# Patient Record
Sex: Female | Born: 2000 | Race: Black or African American | Hispanic: No | Marital: Single | Smoking: Never smoker
Health system: Southern US, Community
[De-identification: ages and names within clinical notes are randomized; demographics above are authoritative.]

## PROBLEM LIST (undated history)

## (undated) DIAGNOSIS — L309 Dermatitis, unspecified: Secondary | ICD-10-CM

## (undated) DIAGNOSIS — J45909 Unspecified asthma, uncomplicated: Secondary | ICD-10-CM

## (undated) DIAGNOSIS — K219 Gastro-esophageal reflux disease without esophagitis: Secondary | ICD-10-CM

## (undated) HISTORY — PX: OTHER SURGICAL HISTORY: SHX169

---
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
## 2022-01-31 ENCOUNTER — Ambulatory Visit
Admission: EM | Admit: 2022-01-31 | Discharge: 2022-01-31 | Disposition: A | Discharge status: Home / Self Care | Payer: 59 | Attending: Internal Medicine | Admitting: Internal Medicine

## 2022-01-31 ENCOUNTER — Encounter: Payer: Self-pay | Admitting: Emergency Medicine

## 2022-01-31 ENCOUNTER — Inpatient Hospital Stay
Admission: RE | Admit: 2022-01-31 | Discharge: 2022-01-31 | Disposition: A | Discharge status: Home / Self Care | Payer: Medicaid Other | Source: Ambulatory Visit

## 2022-01-31 DIAGNOSIS — Z113 Encounter for screening for infections with a predominantly sexual mode of transmission: Secondary | ICD-10-CM | POA: Insufficient documentation

## 2022-01-31 DIAGNOSIS — N898 Other specified noninflammatory disorders of vagina: Secondary | ICD-10-CM | POA: Insufficient documentation

## 2022-01-31 MED ORDER — FLUCONAZOLE 150 MG PO TABS: 150.0000 mg | ORAL_TABLET | ORAL | 0 refills | Status: DC

## 2022-01-31 NOTE — ED Triage Notes (Signed)
Patient c/o vaginal discharge, milky white, itchy for several days.  No concern for STI.  Patient denies any OTC meds.

## 2022-01-31 NOTE — Discharge Instructions (Addendum)
It appears that you may have a yeast infection of the vaginal area.  This is being treated with Diflucan pills.  Your vaginal swab is pending.  We will call if anything is abnormal and treat as appropriate.  Please refrain from sexual activity until test results and treatment are complete.

## 2022-01-31 NOTE — ED Provider Notes (Signed)
[facility]    CSN: [id number] Arrival date & time: 01/31/22  1431      History   Chief Complaint Chief Complaint  Patient presents with   Vaginal Itching    Entered by patient    HPI Susan Rich is a 21 y.o. female.   Patient presents with milky white vaginal discharge and vaginal itching that has been present for a few days.  Patient denies any associated dysuria, urinary frequency, abdominal pain, fever, back pain, abnormal vaginal bleeding, hematuria.  Last menstrual cycle was 01/16/2022.  Patient denies any known exposure to STD and reports that she has not had unprotected intercourse for approximately 1 month.   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      Home Medications    Prior to Admission medications   Medication Sig Start Date End Date Taking? Authorizing Provider  Ascorbic Acid (VITAMIN C PO) Take 1 tablet by mouth daily.    Yes [provider]  cetirizine (ZYRTEC) 10 MG tablet Take 10 mg by mouth daily.   Yes [provider]  cholecalciferol (VITAMIN D3) 25 MCG (1000 UT) tablet Take 1,000 Units by mouth daily.   Yes [provider]  fluconazole (DIFLUCAN) 150 MG tablet Take 1 tablet (150 mg total) by mouth every 3 (three) days. Take first pill today.  If symptoms are still present, take second pill 3 days from first pill. 01/31/22  Yes Aamari Strawderman, Hildred Alamin E, FNP  COVID-19 Ad26 vaccine, JANSSEN/J&J, (JANSSEN COVID-19 VACCINE) 0.5 ML injection Inject into the muscle. 11/16/21   Carlyle Basques, MD  ibuprofen (ADVIL) 200 MG tablet Take 600 mg by mouth every 6 (six) hours as needed for moderate pain.    [provider]  metroNIDAZOLE (FLAGYL) 500 MG tablet Take 1 tablet (500 mg total) by mouth 2 (two) times daily. 03/01/21   Lamptey, Myrene Galas, MD    Family History History reviewed. No pertinent family history.  Social History Social History   Tobacco Use   Smoking status: Never   Smokeless tobacco: Never  Vaping Use   Vaping Use: Never used  Substance Use Topics   Alcohol use: Never   Drug use: Never     Allergies   Peanut-containing drug products   Review of Systems Review of Systems Per HPI  Physical Exam Triage Vital Signs ED Triage Vitals  Enc Vitals Group     BP 01/31/22 1443 115/68     Pulse Rate 01/31/22 1443 89     Resp 01/31/22 1443 18     Temp 01/31/22 1443 98.2 F (36.8 C)     Temp Source 01/31/22 1443 Oral     SpO2 01/31/22 1443 99 %     Weight 01/31/22 1445 170 lb (77.1 kg)     Height 01/31/22 1445 '5\' 9"'$  (1.753 m)     Head Circumference --      Peak Flow --      Pain Score 01/31/22  1445 0     Pain Loc --      Pain Edu? --      Excl. in [place]? --    No data found.  Updated Vital Signs BP 115/68 (BP Location: Right Arm)   Pulse 89   Temp 98.2 F (36.8 C) (Oral)   Resp 18   Ht '5\' 9"'$  (1.753 m)   Wt 170 lb (77.1 kg)   LMP 01/16/2022   SpO2 99%   BMI 25.10 kg/m   Visual Acuity Right Eye Distance:   Left Eye Distance:   Bilateral Distance:    Right Eye Near:   Left Eye Near:    Bilateral Near:     Physical Exam Constitutional:      General: She is not in acute distress.    Appearance: Normal appearance. She is not toxic-appearing or diaphoretic.  HENT:     Head: Normocephalic and atraumatic.  Eyes:     Extraocular Movements: Extraocular movements intact.     Conjunctiva/sclera: Conjunctivae normal.  Pulmonary:     Effort: Pulmonary effort is normal.  Genitourinary:    Comments: Deferred with shared  decision making.  Self swab performed. Neurological:     General: No focal deficit present.     Mental Status: She is alert and oriented to person, place, and time. Mental status is at baseline.  Psychiatric:        Mood and Affect: Mood normal.        Behavior: Behavior normal.        Thought Content: Thought content normal.        Judgment: Judgment normal.     UC Treatments / Results  Labs (all labs ordered are listed, but only abnormal results are displayed) Labs Reviewed  CERVICOVAGINAL ANCILLARY ONLY    EKG   Radiology No results found.  Procedures Procedures (including critical care time)  Medications Ordered in UC Medications - No data to display  Initial Impression / Assessment and Plan / UC Course  I have reviewed the triage vital signs and the nursing notes.  Pertinent labs & imaging results that were available during my care of the patient were reviewed by me and considered in my medical decision making (see chart for details).     Suspicious for candidiasis of vaginal area.  Will treat with Diflucan.  Cervicovaginal swab pending.  Patient wanted STD testing as well so this is included in cervicovaginal swab.  Patient advised to refrain from sexual activity until test results and treatment are complete.  Patient verbalized understanding and was agreeable with plan. Final Clinical Impressions(s) / UC Diagnoses   Final diagnoses:  Vaginal discharge  Screening examination for venereal disease     Discharge Instructions      It appears that you may have a yeast infection of the vaginal area.  This is being treated with Diflucan pills.  Your vaginal swab is pending.  We will call if anything is abnormal and treat as appropriate.  Please refrain from sexual activity until test results and treatment are complete.    ED Prescriptions     Medication Sig Dispense Auth. Provider   fluconazole (DIFLUCAN) 150 MG tablet Take 1 tablet (150 mg total) by mouth  every 3 (three) days. Take first pill today.  If symptoms are still present, take second pill 3 days from first pill. 2 tablet [place], Michele Rockers, [place]      PDMP not reviewed this encounter.   Teodora Medici, [place]  01/31/22 1458  

## 2022-02-01 ENCOUNTER — Telehealth (HOSPITAL_COMMUNITY): Payer: Self-pay | Admitting: Emergency Medicine

## 2022-02-01 LAB — CERVICOVAGINAL ANCILLARY ONLY
Bacterial Vaginitis (gardnerella): POSITIVE — AB
Candida Glabrata: NEGATIVE
Candida Vaginitis: POSITIVE — AB
Chlamydia: NEGATIVE
Comment: NEGATIVE
Comment: NEGATIVE
Comment: NEGATIVE
Comment: NEGATIVE
Comment: NEGATIVE
Comment: NORMAL
Neisseria Gonorrhea: NEGATIVE
Trichomonas: NEGATIVE

## 2022-02-01 MED ORDER — METRONIDAZOLE 500 MG PO TABS: 500.0000 mg | ORAL_TABLET | Freq: Two times a day (BID) | ORAL | 0 refills | Status: DC

## 2022-03-12 IMAGING — DX DG CHEST 1V PORT
1 series · 1 of 1 positions shown · non-contrast
Comparison: Two-view chest x-ray 10/04/2018

CLINICAL DATA: Mild left-sided chest pain.

EXAM:
PORTABLE CHEST 1 VIEW

[chest ap]
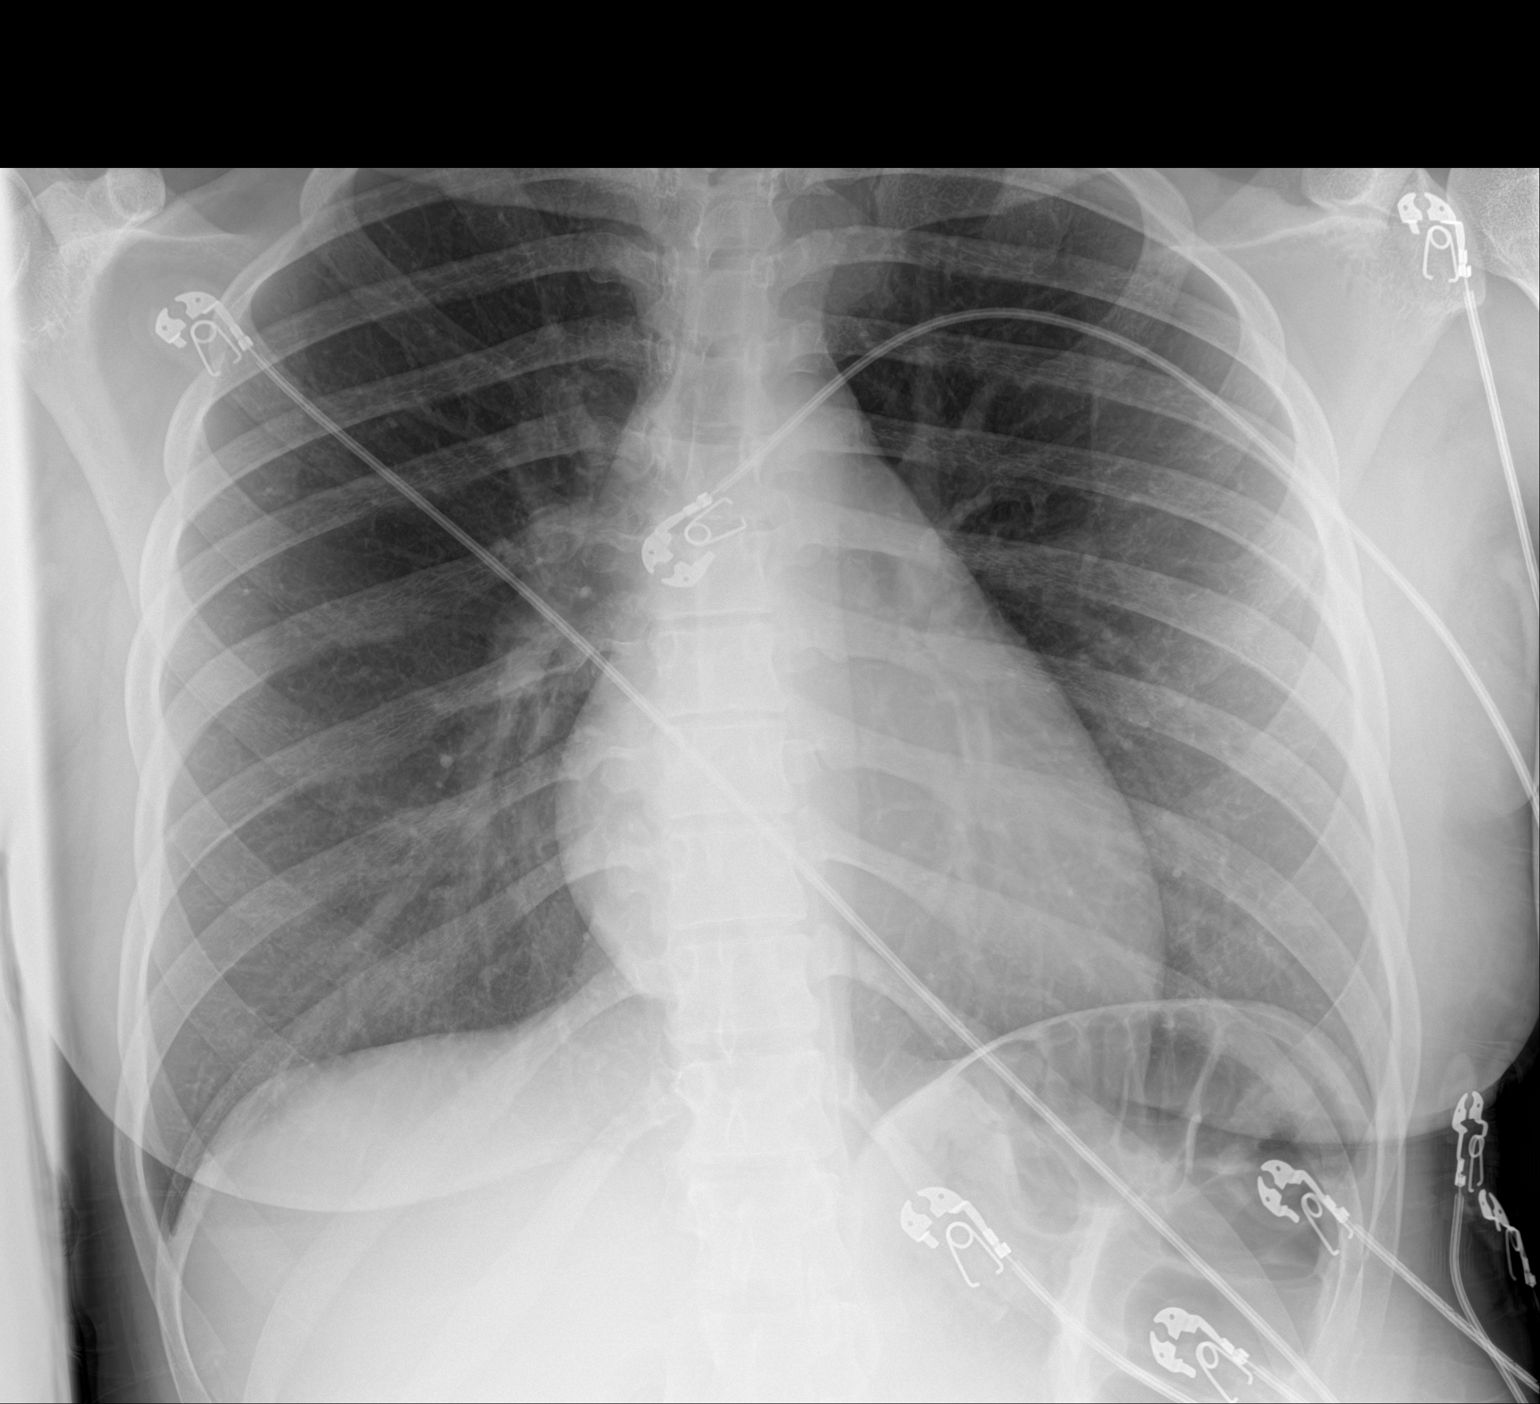

[1 of 1 positions shown; findings below may reference images not displayed]

FINDINGS: The heart size and mediastinal contours are within normal limits.
Both lungs are clear. The visualized skeletal structures are
unremarkable.
IMPRESSION: Negative one-view chest x-ray

## 2023-07-12 ENCOUNTER — Other Ambulatory Visit (HOSPITAL_BASED_OUTPATIENT_CLINIC_OR_DEPARTMENT_OTHER): Payer: Self-pay

## 2024-05-01 ENCOUNTER — Ambulatory Visit: Admission: EM | Admit: 2024-05-01 | Discharge: 2024-05-01 | Disposition: A | Discharge status: Home / Self Care

## 2024-05-01 DIAGNOSIS — J029 Acute pharyngitis, unspecified: Secondary | ICD-10-CM | POA: Diagnosis not present

## 2024-05-01 DIAGNOSIS — J039 Acute tonsillitis, unspecified: Secondary | ICD-10-CM

## 2024-05-01 LAB — POCT RAPID STREP A (OFFICE): Rapid Strep A Screen: NEGATIVE

## 2024-05-01 MED ORDER — AMOXICILLIN-POT CLAVULANATE 875-125 MG PO TABS: 1.0000 | ORAL_TABLET | Freq: Two times a day (BID) | ORAL | 0 refills | Status: AC

## 2024-05-01 MED ORDER — PREDNISONE 20 MG PO TABS
ORAL_TABLET | ORAL | 0 refills | Status: DC
Start: 2024-05-01 — End: 2024-05-02

## 2024-05-01 NOTE — Discharge Instructions (Addendum)
 Advised patient to take medications as directed with food to completion.  Advised patient to take prednisone  with first dose of Augmentin  for the next 5 of 7 days.  Advised if symptoms worsen and or unresolved please follow-up with your PCP or here for further evaluation.  Encouraged to increase daily water intake to 64 ounces per day while taking these medications.  Advised if symptoms worsen and/or unresolved please follow with your PCP or here for further evaluation.

## 2024-05-01 NOTE — ED Provider Notes (Signed)
 Susan Rich CARE    CSN: [id number] Arrival date & time: 05/01/24  1319      History   Chief Complaint Chief Complaint  Patient presents with   Sore Throat    HPI Susan Rich is a 23 y.o. female.   HPI 23 year old female presents with sore throat for 2 days.  Slight dry cough.  Reports taking allergy medicine.  PMH significant for asthma and eczema.  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      Home Medications    Prior to Admission medications   Medication Sig Start Date End Date Taking? Authorizing Provider  amoxicillin -clavulanate (AUGMENTIN ) 875-125 MG tablet Take 1 tablet by mouth every 12 (twelve) hours. 05/01/24  Yes Teddy Sharper, FNP  omeprazole  (PRILOSEC) 40 MG capsule Take 40 mg by mouth daily.   Yes [provider]  predniSONE  (DELTASONE ) 20 MG tablet Take 3 tabs PO daily x 5 days. 05/01/24  Yes Teddy Sharper, FNP  Ascorbic Acid (VITAMIN C PO) Take 1 tablet by mouth daily.     [provider]  cetirizine (ZYRTEC) 10 MG tablet Take 10 mg by mouth daily.    [provider]  cholecalciferol (VITAMIN D3) 25 MCG (1000 UT) tablet Take 1,000 Units by mouth daily.    [provider]  COVID-19 Ad26 vaccine, JANSSEN/J&J, (JANSSEN COVID-19 VACCINE) 0.5 ML injection Inject into the muscle. 11/16/21   Luiz Channel, MD  ibuprofen  (ADVIL ) 200 MG tablet Take 600 mg by mouth every 6 (six) hours as needed for moderate pain.    [provider]    Family History History reviewed. No pertinent family  history.  Social History Social History   Tobacco Use   Smoking status: Never   Smokeless tobacco: Never  Vaping Use   Vaping status: Never Used  Substance Use Topics   Alcohol use: Never   Drug use: Never     Allergies   Peanut-containing drug products   Review of Systems Review of Systems   Physical Exam Triage Vital Signs ED Triage Vitals  Encounter Vitals Group     BP 05/01/24 1331 114/76     Girls Systolic BP Percentile --      Girls Diastolic BP Percentile --      Boys Systolic BP Percentile --      Boys Diastolic BP Percentile --      Pulse Rate 05/01/24 1331 80     Resp 05/01/24 1331 17     Temp 05/01/24 1331 98.3 F (36.8 C)     Temp Source 05/01/24 1331 Oral     SpO2 05/01/24 1331 100 %     Weight --      Height --      Head Circumference --      Peak Flow --      Pain Score 05/01/24 1332 6     Pain Loc --      Pain Education --      Exclude from [employer]  Chart --    No data found.  Updated Vital Signs BP 114/76 (BP Location: Right Arm)   Pulse 80   Temp 98.3 F (36.8 C) (Oral)   Resp 17   LMP 04/27/2024 (Exact Date)   SpO2 100%    Physical Exam Vitals and nursing note reviewed.  Constitutional:      Appearance: She is well-developed. She is obese.  HENT:     Head: Normocephalic and atraumatic.     Right Ear: Tympanic membrane, ear canal and external ear normal.     Left Ear: Tympanic membrane, ear canal and external ear normal.     Mouth/Throat:     Mouth: Mucous membranes are moist.     Pharynx: Oropharynx is clear. Uvula midline. Posterior oropharyngeal erythema present.     Tonsils: Tonsillar exudate present. 4+ on the right. 4+ on the left.  Eyes:     Extraocular Movements: Extraocular movements intact.     Conjunctiva/sclera: Conjunctivae normal.     Pupils: Pupils are equal, round, and reactive to light.  Cardiovascular:     Rate and Rhythm: Normal rate and regular rhythm.     Heart sounds: Normal heart sounds. No murmur  heard. Pulmonary:     Effort: Pulmonary effort is normal.     Breath sounds: Normal breath sounds. No wheezing, rhonchi or rales.  Musculoskeletal:        General: Normal range of motion.     Cervical back: Normal range of motion and neck supple.  Skin:    General: Skin is warm and dry.  Neurological:     General: No focal deficit present.     Mental Status: She is alert and oriented to person, place, and time.  Psychiatric:        Mood and Affect: Mood normal.        Behavior: Behavior normal.      UC Treatments / Results  Labs (all labs ordered are listed, but only abnormal results are displayed) Labs Reviewed  POCT RAPID STREP A (OFFICE)    EKG   Radiology No results found.  Procedures Procedures (including critical care time)  Medications Ordered in UC Medications - No data to display  Initial Impression / Assessment and Plan / UC Course  I have reviewed the triage vital signs and the nursing notes.  Pertinent labs & imaging results that were available during my care of the patient were reviewed by me and considered in my medical decision making (see chart for details).     MDM: 1.  Acute tonsillitis, unspecified etiology-Rx'd Augmentin  875/125 mg tablet: Take 1 tablet twice daily x 7 days; 2.  Sore throat-rapid strep negative, Rx'd prednisone  20 mg tablet: Take 3 tablets p.o. daily x 5 days Final Clinical Impressions(s) / UC Diagnoses   Final diagnoses:  Sore throat  Acute tonsillitis, unspecified etiology     Discharge Instructions      Advised patient to take medications as directed with food to completion.  Advised patient to take prednisone  with first dose of Augmentin  for the next 5 of 7 days.  Advised if symptoms worsen and or unresolved please follow-up with your PCP or here for further evaluation.  Encouraged to increase daily water intake to 64 ounces per day while taking these medications.  Advised if symptoms worsen and/or unresolved please  follow with your PCP or here for further evaluation.     ED Prescriptions     Medication Sig Dispense Auth. Provider  amoxicillin -clavulanate (AUGMENTIN ) 875-125 MG tablet Take 1 tablet by mouth every 12 (twelve) hours. 14 tablet Joell Buerger, FNP   predniSONE  (DELTASONE ) 20 MG tablet Take 3 tabs PO daily x 5 days. 15 tablet Axcel Horsch, FNP      PDMP not reviewed this encounter.   Teddy Sharper, FNP 05/01/24 1425

## 2024-05-01 NOTE — ED Triage Notes (Signed)
 Pt c/o sore throat x 2 days. Slight dry cough. No known fever. Taking nyquil and dayquil prn. Also takes OTC allergy med daily.

## 2024-05-02 ENCOUNTER — Other Ambulatory Visit (HOSPITAL_COMMUNITY)
Admission: RE | Admit: 2024-05-02 | Discharge: 2024-05-02 | Disposition: A | Discharge status: Home / Self Care | Source: Ambulatory Visit

## 2024-05-02 ENCOUNTER — Ambulatory Visit

## 2024-05-02 ENCOUNTER — Telehealth: Payer: Self-pay

## 2024-05-02 VITALS — BP 116/83 | HR 96 | Temp 97.8°F | Ht 69.0 in | Wt 213.0 lb

## 2024-05-02 DIAGNOSIS — Z13228 Encounter for screening for other metabolic disorders: Secondary | ICD-10-CM

## 2024-05-02 DIAGNOSIS — Z0000 Encounter for general adult medical examination without abnormal findings: Secondary | ICD-10-CM | POA: Insufficient documentation

## 2024-05-02 DIAGNOSIS — Z91010 Allergy to peanuts: Secondary | ICD-10-CM | POA: Insufficient documentation

## 2024-05-02 DIAGNOSIS — J4520 Mild intermittent asthma, uncomplicated: Secondary | ICD-10-CM

## 2024-05-02 DIAGNOSIS — R490 Dysphonia: Secondary | ICD-10-CM

## 2024-05-02 DIAGNOSIS — G43909 Migraine, unspecified, not intractable, without status migrainosus: Secondary | ICD-10-CM

## 2024-05-02 DIAGNOSIS — R41840 Attention and concentration deficit: Secondary | ICD-10-CM

## 2024-05-02 DIAGNOSIS — Z113 Encounter for screening for infections with a predominantly sexual mode of transmission: Secondary | ICD-10-CM

## 2024-05-02 DIAGNOSIS — J45909 Unspecified asthma, uncomplicated: Secondary | ICD-10-CM | POA: Insufficient documentation

## 2024-05-02 DIAGNOSIS — Z1159 Encounter for screening for other viral diseases: Secondary | ICD-10-CM

## 2024-05-02 DIAGNOSIS — G43109 Migraine with aura, not intractable, without status migrainosus: Secondary | ICD-10-CM

## 2024-05-02 DIAGNOSIS — L309 Dermatitis, unspecified: Secondary | ICD-10-CM

## 2024-05-02 MED ORDER — OMEPRAZOLE 40 MG PO CPDR: 40.0000 mg | DELAYED_RELEASE_CAPSULE | Freq: Every day | ORAL | 2 refills | Status: AC

## 2024-05-02 MED ORDER — ALBUTEROL SULFATE HFA 108 (90 BASE) MCG/ACT IN AERS: 2.0000 | INHALATION_SPRAY | Freq: Four times a day (QID) | RESPIRATORY_TRACT | 2 refills | Status: AC | PRN

## 2024-05-02 MED ORDER — EPINEPHRINE 0.3 MG/0.3ML IJ SOAJ: 0.3000 mg | INTRAMUSCULAR | 0 refills | Status: AC | PRN

## 2024-05-02 MED ORDER — NURTEC 75 MG PO TBDP: 75.0000 mg | ORAL_TABLET | Freq: Every day | ORAL | 2 refills | Status: DC | PRN

## 2024-05-02 MED ORDER — TRIAMCINOLONE ACETONIDE 0.1 % EX OINT: 1.0000 | TOPICAL_OINTMENT | Freq: Two times a day (BID) | CUTANEOUS | 2 refills | Status: AC

## 2024-05-02 NOTE — Assessment & Plan Note (Signed)
 Concerns about focus, anxiety, and learning differences suggestive of ADHD. - Refer to [facility] for ADHD testing.

## 2024-05-02 NOTE — Assessment & Plan Note (Signed)
 Occurs 4-6 times per month. Previously managed with ibuprofen . Nurtec prescribed with savings card to reduce cost. - Prescribe Nurtec for acute migraine management. - Provide savings card for Nurtec to reduce cost.

## 2024-05-02 NOTE — Assessment & Plan Note (Signed)
 Refill for epi-pen placed today

## 2024-05-02 NOTE — Assessment & Plan Note (Signed)
 Start triamcinolone  0.1% cream BID as needed for eczema flares.

## 2024-05-02 NOTE — Telephone Encounter (Signed)
 Called to check on patient. Has not picked up meds yet but will today. No needs from ucc.

## 2024-05-02 NOTE — Assessment & Plan Note (Signed)
 Stable and well controled with albuterol  inhaler PRN.

## 2024-05-02 NOTE — Progress Notes (Signed)
 New Patient Office Visit  Subjective    Patient ID: Susan Rich, female    DOB: 29-Dec-2000  Age: 23 y.o. MRN: [id number]  CC:  Chief Complaint  Patient presents with   New Patient (Initial Visit)    Establish Care   History of Present Illness   Susan Rich is a 23 year old female who presents for an initial primary care appointment.  Upper respiratory symptoms - Sore throat onset two days ago - Diagnosed with tonsillitis after negative strep throat test - Currently taking prednisone  and amoxicillin  for tonsillitis - Recurrent hoarseness. Has been seen for hoarseness in the past and was told it was due to reflux. Despite taking omeprazole , she is still experiencing hoarseness and would like to talk to an ENT specialist about treatment options.   Asthma and allergic disorders - Asthma managed with infrequent use of albuterol  inhaler - Peanut allergy; carries EpiPen  for emergencies - Uses over-the-counter allergy medication (Zyrtec) as needed  Dermatologic symptoms - Eczema primarily affecting hands, with occasional involvement of arms and neck  Neurologic symptoms - Migraines occurring approximately four to six times per month - Migraines often accompanied by aura - Uses ibuprofen  for symptomatic relief - Interested in exploring alternative treatment options  Neuropsychiatric symptoms - Concerns regarding potential ADHD symptoms, including difficulty focusing and anxiety - Interested in further evaluation for ADHD  Gynecologic and sexual health - Sexually active with one partner - Uses condoms for contraception - Not currently on hormonal birth control - HPV vaccination status unclear - Has not established care with a gynecologist - Considering Pap smear      Screenings:  Colon Cancer: N/A Lung Cancer: N/A Breast Cancer: N/A Cervical Cancer: Will plan to do first pap smear in Dec 2025 while she is on Christmas Break Diabetes: Checking A1c with  labs HLD: Checking lipid panel with labs The ASCVD Risk score (Arnett DK, et al., 2019) failed to calculate for the following reasons:   The 2019 ASCVD risk score is only valid for ages 25 to 85   Outpatient Encounter Medications as of 05/02/2024  Medication Sig   albuterol  (VENTOLIN  HFA) 108 (90 Base) MCG/ACT inhaler Inhale 2 puffs into the lungs every 6 (six) hours as needed for wheezing or shortness of breath.   amoxicillin -clavulanate (AUGMENTIN ) 875-125 MG tablet Take 1 tablet by mouth every 12 (twelve) hours.   cetirizine (ZYRTEC) 10 MG tablet Take 10 mg by mouth daily.   EPINEPHrine  0.3 mg/0.3 mL IJ SOAJ injection Inject 0.3 mg into the muscle as needed for anaphylaxis (Peanut allergy).   Rimegepant Sulfate (NURTEC) 75 MG TBDP Take 1 tablet (75 mg total) by mouth daily as needed.   triamcinolone  ointment (KENALOG ) 0.1 % Apply 1 Application topically 2 (two) times daily.   [DISCONTINUED] Ascorbic Acid (VITAMIN C PO) Take 1 tablet by mouth daily.    [DISCONTINUED] cholecalciferol (VITAMIN D3) 25 MCG (1000 UT) tablet Take 1,000 Units by mouth daily.   [DISCONTINUED] COVID-19 Ad26 vaccine, JANSSEN/J&J, (JANSSEN COVID-19 VACCINE) 0.5 ML injection Inject into the muscle.   [DISCONTINUED] ibuprofen  (ADVIL ) 200 MG tablet Take 600 mg by mouth every 6 (six) hours as needed for moderate pain.   [DISCONTINUED] omeprazole  (PRILOSEC) 40 MG capsule Take 40 mg by mouth daily.   [DISCONTINUED] predniSONE  (DELTASONE ) 20 MG tablet Take 3 tabs PO daily x 5 days.   omeprazole  (PRILOSEC) 40 MG capsule Take 1 capsule (40 mg total) by mouth daily.   No facility-administered encounter medications on file  as of 05/02/2024.    Past Medical History:  Diagnosis Date   Acid reflux    Asthma    Eczema     Past Surgical History:  Procedure Laterality Date   MASS EXCISION Right 11/08/2018   Procedure: RIGHT BREAST MASS EXCISION;  Surgeon: Ebbie Cough, MD;  Location: [facility];  Service: General;   Laterality: Right;   no surgical history      History reviewed. No pertinent family history.  Social History   Socioeconomic History   Marital status: Single    Spouse name: Not on file   Number of children: Not on file   Years of education: Not on file   Highest education level: High school graduate  Occupational History   Not on file  Tobacco Use   Smoking status: Never   Smokeless tobacco: Never  Vaping Use   Vaping status: Never Used  Substance and Sexual Activity   Alcohol use: Never   Drug use: Never   Sexual activity: Yes    Birth control/protection: None  Other Topics Concern   Not on file  Social History Narrative   Caffeine Intake: _1__   cups per day   Weight:    Are you satisfied with your weight? No   Diet: How do you rate your diet?Poor   Do you eat or drink 4 servings of daily or soy daily or take calcium supplements?No   Exercise:    Do you exercise regularly?Yes   What type of exercise?Cardio weight-lifting   How long(minutes)?1 hour   How often? 4 days/week   Safety:    Do you wear a bike helmet? No   Do you use your seatbelts constantly?Yes   Is violence at home a concern for you?No   Have you ever been abused?   Do you have a gun in your home?No   Social Drivers of Corporate investment banker Strain: Not on file  Food Insecurity: Not on file  Transportation Needs: Not on file  Physical Activity: Not on file  Stress: Not on file  Social Connections: Unknown (01/04/2022)   Received from [facility]   Social Network    Social Network: Not on file  Intimate Partner Violence: Unknown (12/09/2021)   Received from [facility]   HITS    Physically Hurt: Not on file    Insult or Talk Down To: Not on file    Threaten Physical Harm: Not on file    Scream or Curse: Not on file    ROS  Per HPI      Objective    BP 116/83   Pulse 96   Temp 97.8 F (36.6 C) (Oral)   Ht 5' 9 (1.753 m)   Wt 213 lb 0.6 oz (96.6 kg)   LMP 04/27/2024  (Exact Date)   SpO2 100%   BMI 31.46 kg/m   Physical Exam Constitutional:      General: She is not in acute distress.    Appearance: Normal appearance.  Cardiovascular:     Rate and Rhythm: Normal rate and regular rhythm.     Heart sounds: Normal heart sounds. No murmur heard.    No friction rub. No gallop.  Pulmonary:     Effort: Pulmonary effort is normal. No respiratory distress.     Breath sounds: Normal breath sounds.  Musculoskeletal:        General: No swelling.  Skin:    General: Skin is warm and dry.  Neurological:  General: No focal deficit present.     Mental Status: She is alert.  Psychiatric:        Mood and Affect: Mood normal.        Behavior: Behavior normal.        Thought Content: Thought content normal.        Assessment & Plan:   Attention deficit Assessment & Plan: Concerns about focus, anxiety, and learning differences suggestive of ADHD. - Refer to [facility] for ADHD testing.   Orders: -     Ambulatory referral to Psychiatry  Hoarseness -     Ambulatory referral to ENT  Routine screening for STI (sexually transmitted infection) -     Cervicovaginal ancillary only  Acute migraine -     Nurtec; Take 1 tablet (75 mg total) by mouth daily as needed.  Dispense: 16 tablet; Refill: 2  Screening for endocrine, nutritional, metabolic and immunity disorder -     VITAMIN D 25 Hydroxy (Vit-D Deficiency, Fractures); Future -     CBC with Differential/Platelet; Future -     Hemoglobin A1c; Future -     Comprehensive metabolic panel with GFR; Future -     Lipid panel; Future -     TSH; Future  Screening for viral disease -     HIV Antibody (routine testing w rflx); Future -     Hepatitis C antibody; Future  Mild intermittent asthma without complication Assessment & Plan: Stable and well controled with albuterol  inhaler PRN.    Peanut allergy Assessment & Plan: Refill for epi-pen placed today   Eczema,  unspecified type Assessment & Plan: Start triamcinolone  0.1% cream BID as needed for eczema flares.   Migraine with aura and without status migrainosus, not intractable Assessment & Plan: Occurs 4-6 times per month. Previously managed with ibuprofen . Nurtec prescribed with savings card to reduce cost. - Prescribe Nurtec for acute migraine management. - Provide savings card for Nurtec to reduce cost.   Hoarseness of voice Assessment & Plan: Potentially related to GERD and tonsillitis, impacting singing. GERD previously treated with omeprazole  but condition not improving. - Refer to ENT for evaluation and management of hoarseness, per patient request.   Encounter for medical examination to establish care Assessment & Plan: First primary care appointment. - Establish care and review past medical history. - Discuss health screenings and vaccinations. - Schedule Pap smear for December. - Schedule fasting blood work for next week.    Other orders -     EPINEPHrine ; Inject 0.3 mg into the muscle as needed for anaphylaxis (Peanut allergy).  Dispense: 1 each; Refill: 0 -     Albuterol  Sulfate HFA; Inhale 2 puffs into the lungs every 6 (six) hours as needed for wheezing or shortness of breath.  Dispense: 8.5 g; Refill: 2 -     Omeprazole ; Take 1 capsule (40 mg total) by mouth daily.  Dispense: 30 capsule; Refill: 2 -     Triamcinolone  Acetonide; Apply 1 Application topically 2 (two) times daily.  Dispense: 30 g; Refill: 2    Return in about 6 months (around 11/01/2024) for pap, migraine f/u.   Saddie JULIANNA Sacks, PA-C

## 2024-05-02 NOTE — Patient Instructions (Signed)
 VISIT SUMMARY: Susan Rich, a 23 year old female, came in for her initial primary care appointment. She presented with a sore throat, recurrent hoarseness, asthma, peanut allergy, acid reflux, eczema, migraines, and concerns about ADHD. We discussed her current treatments and made several referrals and prescription adjustments to better manage her conditions.  YOUR PLAN: -ACUTE TONSILLITIS: You have been diagnosed with tonsillitis, which is an inflammation of the tonsils. We will refer you to an ENT specialist to evaluate your recurrent hoarseness and discuss the possibility of a tonsillectomy.  -RECURRENT HOARSENESS: Your hoarseness may be related to acid reflux and tonsillitis, which is affecting your singing. We will refer you to an ENT specialist for further evaluation and management.  -GASTROESOPHAGEAL REFLUX DISEASE (GERD): GERD is a condition where stomach acid frequently flows back into the tube connecting your mouth and stomach. We will refill your prescription for omeprazole  to manage your symptoms.  -MIGRAINE WITH AURA: Migraines are severe headaches often accompanied by visual disturbances. We will prescribe Nurtec for your acute migraine management and provide a savings card to help reduce the cost.  -SUSPECTED ATTENTION-DEFICIT/HYPERACTIVITY DISORDER (ADHD): ADHD is a condition characterized by difficulty focusing, hyperactivity, and impulsiveness. We will refer you to [facility] for ADHD testing. Address: [address] [place], [place], [place] [address] Phone: [phone]  -ECZEMA OF HANDS, ARMS, AND HIP: Eczema is a condition that makes your skin red and itchy. We will prescribe Eucrisa cream with refills to help manage your symptoms.  -PEANUT ALLERGY: A peanut allergy is a condition where exposure to peanuts can cause a severe allergic reaction. We will refill your EpiPen  prescription to ensure you have it available in case of an emergency.  -ASTHMA: Asthma is a  condition in which your airways narrow and swell, producing extra mucus. We will refill your albuterol  inhaler prescription to manage your symptoms.  -ALLERGIC RHINITIS: Allergic rhinitis is an allergic reaction that causes sneezing, congestion, and a runny nose. Continue managing your symptoms with over-the-counter antihistamines like Zyrtec.  -ESTABLISHING PRIMARY CARE: As this is your first primary care appointment, we reviewed your past medical history, discussed health screenings and vaccinations, and planned for future tests and appointments.  INSTRUCTIONS: Please follow up with the ENT specialist for your tonsillitis and hoarseness. Schedule your Pap smear for December and fasting blood work for next week. Additionally, follow up with [facility] for ADHD testing.  If you have any problems before your next visit feel free to message me via MyChart (minor issues or questions) or call the office, otherwise you may reach out to schedule an office visit.  Thank you! Saddie Sacks, PA-C

## 2024-05-02 NOTE — Assessment & Plan Note (Signed)
 Potentially related to GERD and tonsillitis, impacting singing. GERD previously treated with omeprazole  but condition not improving. - Refer to ENT for evaluation and management of hoarseness, per patient request.

## 2024-05-02 NOTE — Progress Notes (Deleted)
 New Patient Office Visit  Subjective    Patient ID: Susan Rich, female    DOB: Jan 05, 2001  Age: 23 y.o. MRN: [id number]  CC:  Chief Complaint  Patient presents with  . New Patient (Initial Visit)    Establish Care    Discussed the use of AI scribe software for clinical note transcription with the patient, who gave verbal consent to proceed.  History of Present Illness     HPI Susan Rich presents to establish care. Prior PCP was *** at ***. Last physical was ***. she notes that she requires refills of ***.  Screenings:  Colon Cancer: *** Lung Cancer: *** Breast Cancer: *** Diabetes: *** Ophthalmology*** Foot*** HLD: ***  The ASCVD Risk score (Arnett DK, et al., 2019) failed to calculate for the following reasons:   The 2019 ASCVD risk score is only valid for ages 63 to 29 Hep B Vax: ***  Acute Problems: ***  Outpatient Encounter Medications as of 05/02/2024  Medication Sig  . amoxicillin -clavulanate (AUGMENTIN ) 875-125 MG tablet Take 1 tablet by mouth every 12 (twelve) hours.  . Ascorbic Acid (VITAMIN C PO) Take 1 tablet by mouth daily.   . cetirizine (ZYRTEC) 10 MG tablet Take 10 mg by mouth daily.  . cholecalciferol (VITAMIN D3) 25 MCG (1000 UT) tablet Take 1,000 Units by mouth daily.  SABRA COVID-19 Ad26 vaccine, JANSSEN/J&J, (JANSSEN COVID-19 VACCINE) 0.5 ML injection Inject into the muscle.  . ibuprofen  (ADVIL ) 200 MG tablet Take 600 mg by mouth every 6 (six) hours as needed for moderate pain.  . omeprazole  (PRILOSEC) 40 MG capsule Take 40 mg by mouth daily.  . predniSONE  (DELTASONE ) 20 MG tablet Take 3 tabs PO daily x 5 days.   No facility-administered encounter medications on file as of 05/02/2024.    Past Medical History:  Diagnosis Date  . Acid reflux   . Asthma   . Eczema     Past Surgical History:  Procedure Laterality Date  . MASS EXCISION Right 11/08/2018   Procedure: RIGHT BREAST MASS EXCISION;  Surgeon: Ebbie Cough, MD;   Location: [facility];  Service: General;  Laterality: Right;  . no surgical history      No family history on file.  Social History   Socioeconomic History  . Marital status: Single    Spouse name: Not on file  . Number of children: Not on file  . Years of education: Not on file  . Highest education level: Not on file  Occupational History  . Not on file  Tobacco Use  . Smoking status: Never  . Smokeless tobacco: Never  Vaping Use  . Vaping status: Never Used  Substance and Sexual Activity  . Alcohol use: Never  . Drug use: Never  . Sexual activity: Yes    Birth control/protection: None  Other Topics Concern  . Not on file  Social History Narrative  . Not on file   Social Drivers of Health   Financial Resource Strain: Not on file  Food Insecurity: Not on file  Transportation Needs: Not on file  Physical Activity: Not on file  Stress: Not on file  Social Connections: Unknown (01/04/2022)   [facility]   Social Network   . Social Network: Not on file  Intimate Partner Violence: Unknown (12/09/2021)   Received from [facility]   HITS   . Physically Hurt: Not on file   . Insult or Talk Down To: Not on file   . Threaten Physical Harm:  Not on file   . Scream or Curse: Not on file    ROS  Per HPI      Objective    BP 116/83   Pulse 96   Temp 97.8 F (36.6 C) (Oral)   Ht 5' 9 (1.753 m)   Wt 213 lb 0.6 oz (96.6 kg)   LMP 04/27/2024 (Exact Date)   SpO2 100%   BMI 31.46 kg/m   Physical Exam  {Labs (Optional):23779}    Assessment & Plan:   There are no diagnoses linked to this encounter.  Assessment and Plan Assessment & Plan      No follow-ups on file.   Saddie JULIANNA Sacks, PA-C

## 2024-05-02 NOTE — Assessment & Plan Note (Signed)
 First primary care appointment. - Establish care and review past medical history. - Discuss health screenings and vaccinations. - Schedule Pap smear for December. - Schedule fasting blood work for next week.

## 2024-05-06 ENCOUNTER — Ambulatory Visit: Payer: Self-pay

## 2024-05-06 LAB — CERVICOVAGINAL ANCILLARY ONLY
Bacterial Vaginitis (gardnerella): NEGATIVE
Candida Glabrata: NEGATIVE
Candida Vaginitis: NEGATIVE
Chlamydia: NEGATIVE
Comment: NEGATIVE
Comment: NEGATIVE
Comment: NEGATIVE
Comment: NEGATIVE
Comment: NEGATIVE
Comment: NORMAL
Neisseria Gonorrhea: NEGATIVE
Trichomonas: NEGATIVE

## 2024-06-10 ENCOUNTER — Other Ambulatory Visit: Payer: Self-pay

## 2024-06-10 MED ORDER — NURTEC 75 MG PO TBDP: 75.0000 mg | ORAL_TABLET | ORAL | 1 refills | Status: AC | PRN

## 2024-10-08 ENCOUNTER — Ambulatory Visit

## 2024-10-21 ENCOUNTER — Ambulatory Visit
